# Patient Record
Sex: Female | Born: 1960 | Race: Asian | Hispanic: No | Marital: Married | Smoking: Never smoker
Health system: Southern US, Community
[De-identification: ages and names within clinical notes are randomized; demographics above are authoritative.]

---

## 2007-02-06 ENCOUNTER — Other Ambulatory Visit: Payer: Self-pay | Admitting: Obstetrics & Gynecology

## 2007-02-06 ENCOUNTER — Ambulatory Visit: Payer: Self-pay | Admitting: Obstetrics & Gynecology

## 2007-02-10 ENCOUNTER — Ambulatory Visit (HOSPITAL_COMMUNITY): Admission: RE | Admit: 2007-02-10 | Discharge: 2007-02-10 | Payer: Self-pay | Admitting: Family Medicine

## 2007-02-10 IMAGING — US US OB DETAIL+14 WK
1 series · 14 of 28 positions shown · non-contrast
Comparison: none

OBSTETRICAL ULTRASOUND:
 This ultrasound was performed in The [HOSPITAL], and the [facility] report will be stored to [REDACTED] PACS.

[Series 1: us ob detail+14 wk · 14 of 58 slices shown]
[im 3/58]
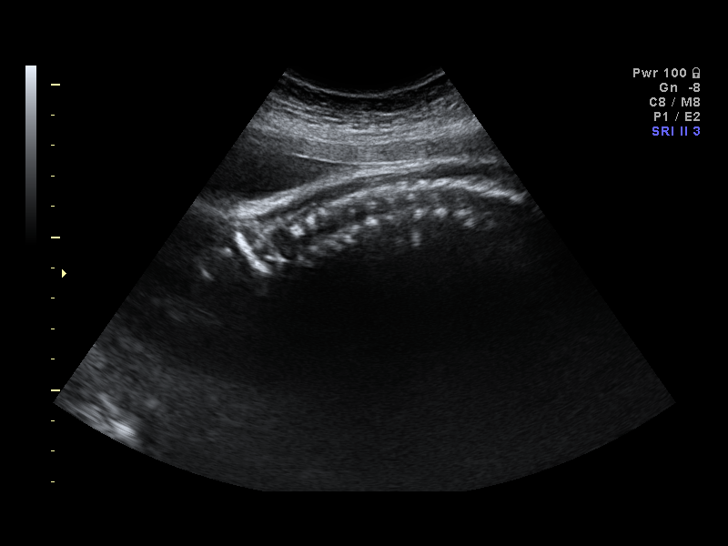
[im 7/58]
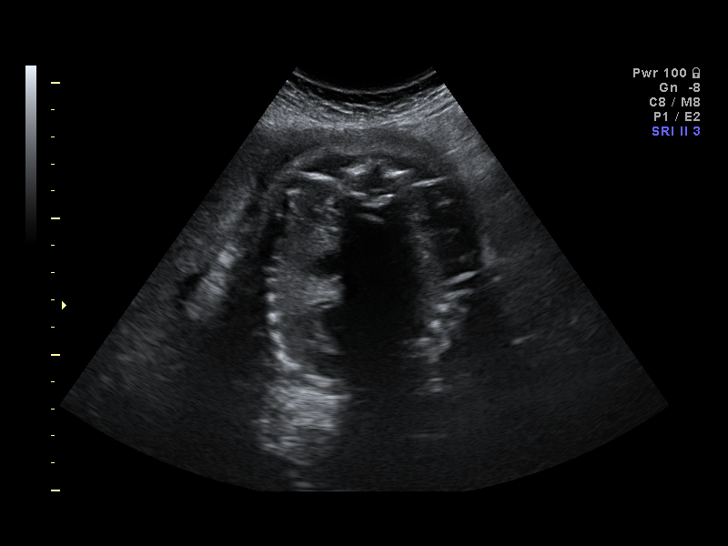
[im 11/58]
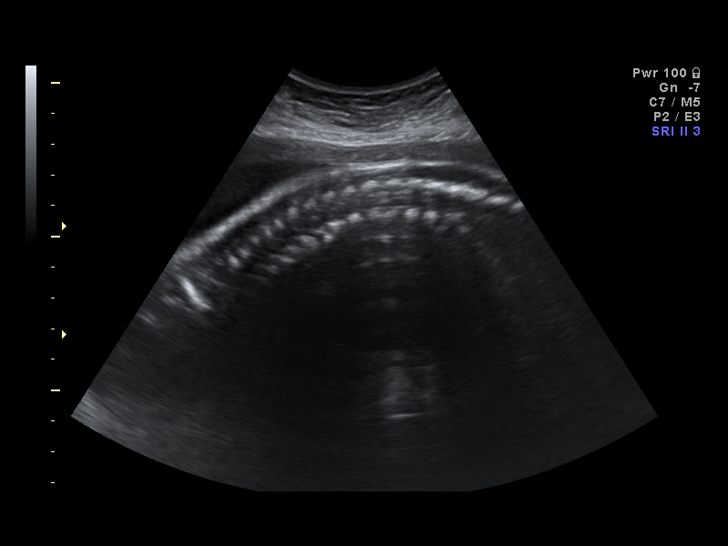
[im 15/58]
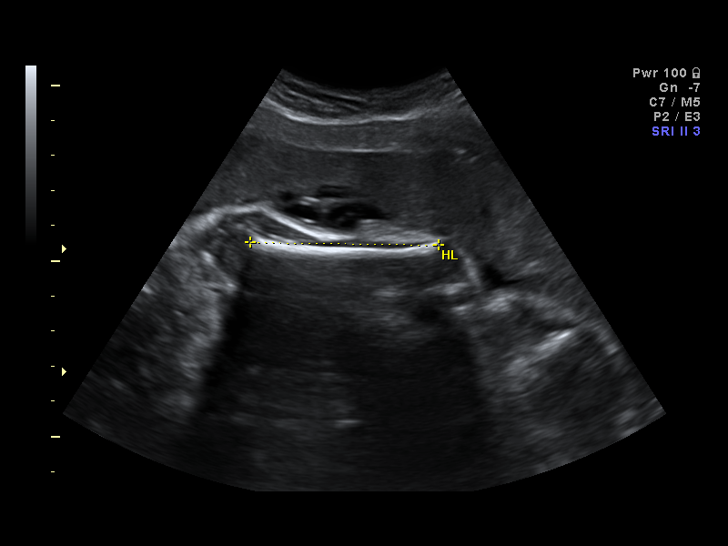
[im 20/58]
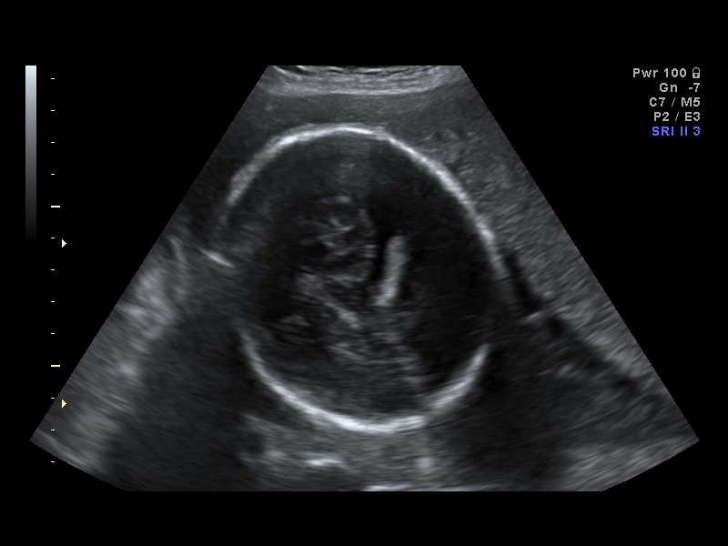
[im 24/58]
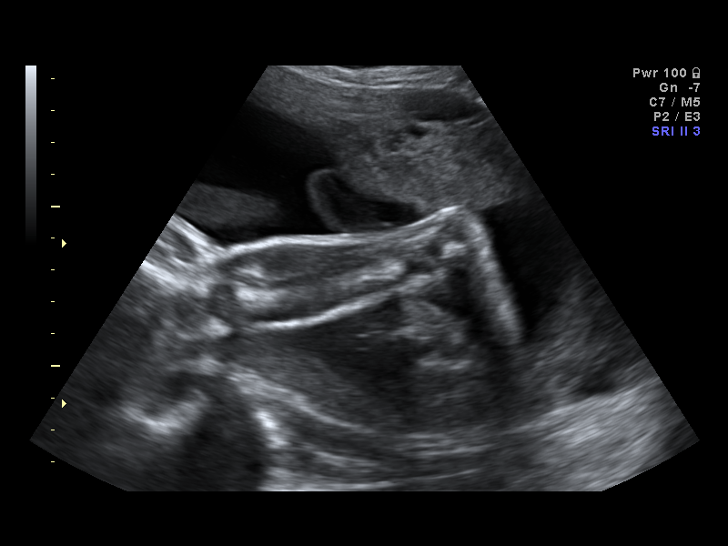
[im 28/58]
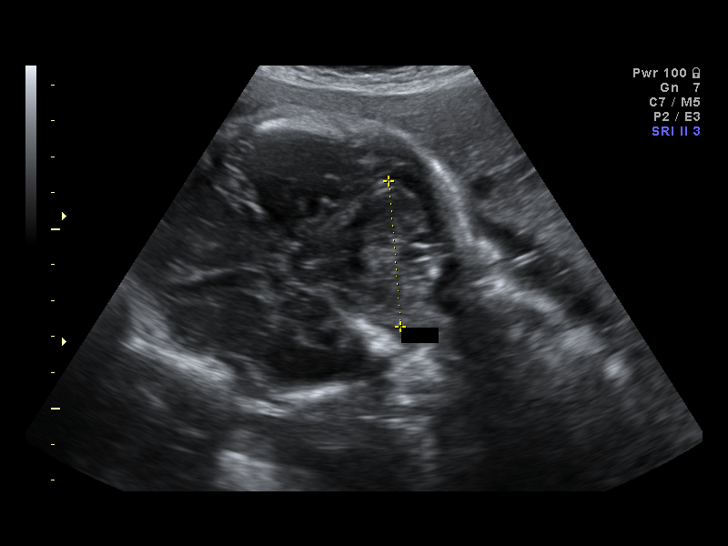
[im 32/58]
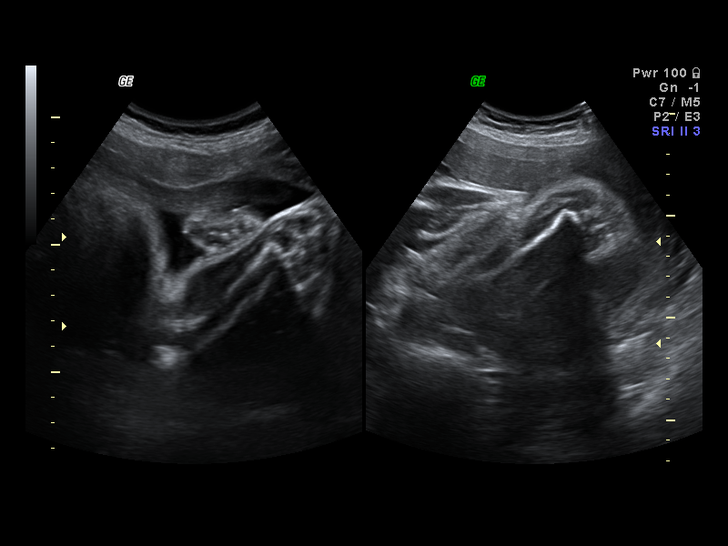
[im 36/58]
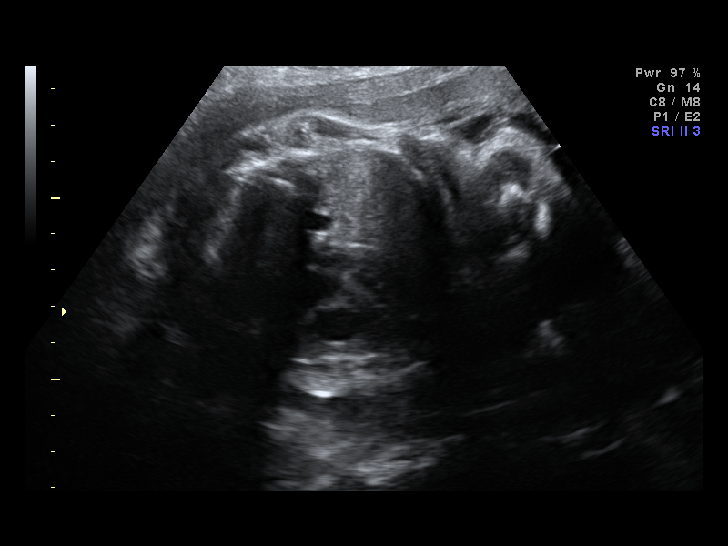
[im 41/58]
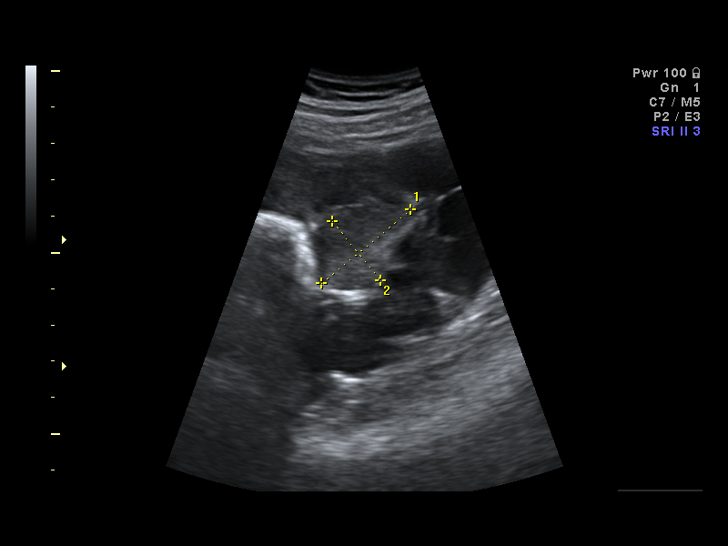
[im 45/58]
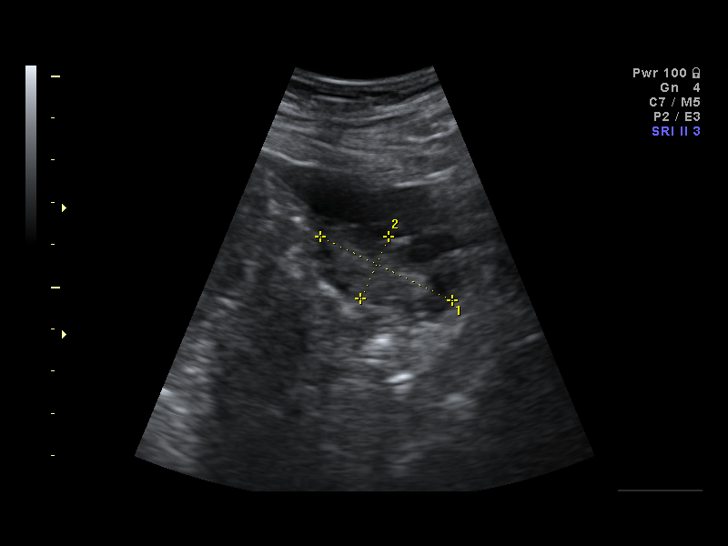
[im 49/58]
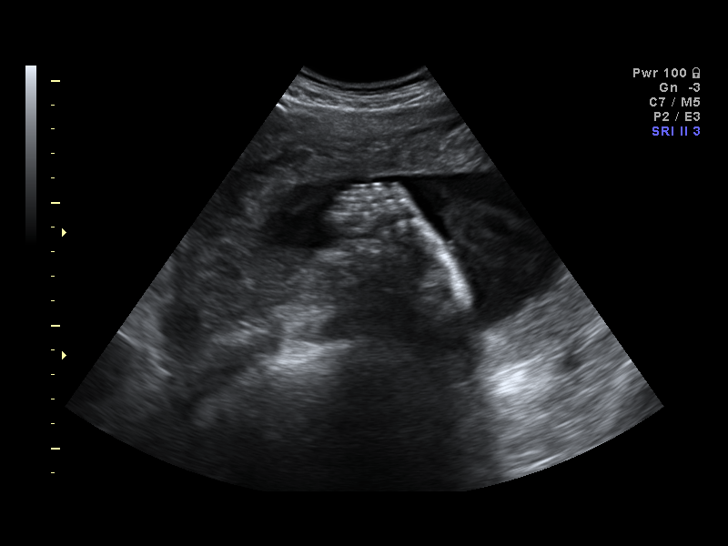
[im 53/58]
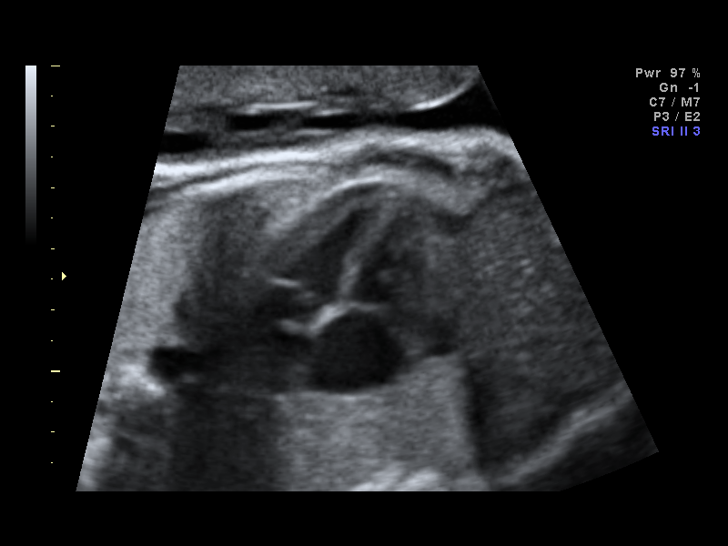
[im 58/58]
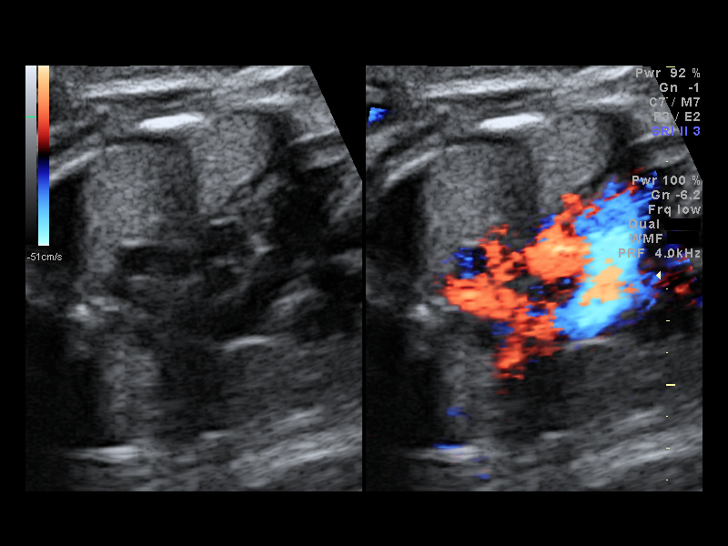

[14 of 28 positions shown; findings below may reference images not displayed]

IMPRESSION: The [facility] report has also been faxed to the ordering physician.

## 2007-03-08 ENCOUNTER — Ambulatory Visit (HOSPITAL_COMMUNITY): Admission: RE | Admit: 2007-03-08 | Discharge: 2007-03-08 | Payer: Self-pay | Admitting: Obstetrics & Gynecology

## 2011-05-11 LAB — POCT URINALYSIS DIP (DEVICE)
Bilirubin Urine: NEGATIVE
Glucose, UA: NEGATIVE
Ketones, ur: NEGATIVE
Nitrite: NEGATIVE
Operator id: 135281
Protein, ur: NEGATIVE
Specific Gravity, Urine: 1.01
Urobilinogen, UA: 0.2
pH: 7

## 2018-02-01 DIAGNOSIS — S90561A Insect bite (nonvenomous), right ankle, initial encounter: Secondary | ICD-10-CM | POA: Insufficient documentation

## 2018-02-01 DIAGNOSIS — Y998 Other external cause status: Secondary | ICD-10-CM | POA: Insufficient documentation

## 2018-02-01 DIAGNOSIS — Y929 Unspecified place or not applicable: Secondary | ICD-10-CM | POA: Diagnosis not present

## 2018-02-01 DIAGNOSIS — R2241 Localized swelling, mass and lump, right lower limb: Secondary | ICD-10-CM | POA: Diagnosis present

## 2018-02-01 DIAGNOSIS — W57XXXA Bitten or stung by nonvenomous insect and other nonvenomous arthropods, initial encounter: Secondary | ICD-10-CM | POA: Diagnosis not present

## 2018-02-01 DIAGNOSIS — L03115 Cellulitis of right lower limb: Secondary | ICD-10-CM | POA: Diagnosis not present

## 2018-02-01 DIAGNOSIS — Y9389 Activity, other specified: Secondary | ICD-10-CM | POA: Insufficient documentation

## 2018-02-02 ENCOUNTER — Other Ambulatory Visit: Payer: Self-pay

## 2018-02-02 ENCOUNTER — Encounter (HOSPITAL_COMMUNITY): Payer: Self-pay | Admitting: Emergency Medicine

## 2018-02-02 ENCOUNTER — Emergency Department (HOSPITAL_COMMUNITY)
Admission: EM | Admit: 2018-02-02 | Discharge: 2018-02-02 | Disposition: A | Discharge status: Home / Self Care | Payer: BLUE CROSS/BLUE SHIELD | Attending: Emergency Medicine | Admitting: Emergency Medicine

## 2018-02-02 DIAGNOSIS — L03115 Cellulitis of right lower limb: Secondary | ICD-10-CM

## 2018-02-02 MED ORDER — DOXYCYCLINE HYCLATE 100 MG PO CAPS: 100.0000 mg | ORAL_CAPSULE | Freq: Two times a day (BID) | ORAL | 0 refills | Status: AC

## 2018-02-02 MED ORDER — NAPROXEN 500 MG PO TABS: 500.0000 mg | ORAL_TABLET | Freq: Two times a day (BID) | ORAL | 0 refills | Status: AC | PRN

## 2018-02-02 MED ORDER — IBUPROFEN 800 MG PO TABS
800.0000 mg | ORAL_TABLET | Freq: Once | ORAL | Status: AC
Start: 2018-02-02 — End: 2018-02-02
  Administered 2018-02-02: 800 mg via ORAL
  Filled 2018-02-02: qty 1

## 2018-02-02 MED ORDER — LORATADINE 10 MG PO TABS
10.0000 mg | ORAL_TABLET | Freq: Once | ORAL | Status: AC
  Administered 2018-02-02: 10 mg via ORAL
  Filled 2018-02-02: qty 1

## 2018-02-02 NOTE — ED Triage Notes (Signed)
Pt has c/o insect bite to back of right foot. Pt felt a sting last night and had itching initially on entire foot. Pt now has swelling and pain 6/10.

## 2018-02-02 NOTE — ED Notes (Signed)
Pt denies N/V/ fever

## 2018-02-02 NOTE — ED Provider Notes (Signed)
[facility]-EMERGENCY DEPT Provider Note   CSN: [id number] Arrival date & time: 02/01/18  2355    History   Chief Complaint Chief Complaint  Patient presents with  . Insect Bite    HPI Kelly Stokes is a 57 y.o. female.   57 y/o female presents to the ED for evaluation of suspected insect bite.  Patient noted area up to the back of her right ankle yesterday.  She states that she felt a stinging sensation last night and has been experiencing ongoing itching.  Symptoms progressed to include pain and soreness which is aggravated with ankle movement.  Discomfort rated at 6/10.  There has been some associated swelling.  No fevers, difficulty ambulating, N/V, numbness or paresthesias.  No medications taken prior to arrival for symptoms.  Patient did not see what insect caused the suspected bite.      History reviewed. No pertinent past medical history.  There are no active problems to display for this patient.   History reviewed. No pertinent surgical history.   OB History   None      Home Medications    Prior to Admission medications   Medication Sig Start Date End Date Taking? Authorizing Provider  doxycycline (VIBRAMYCIN) 100 MG capsule Take 1 capsule (100 mg total) by mouth 2 (two) times daily. 02/02/18   Antony Madura, PA-C  naproxen (NAPROSYN) 500 MG tablet Take 1 tablet (500 mg total) by mouth every 12 (twelve) hours as needed for mild pain or moderate pain. 02/02/18   Antony Madura, PA-C    Family History No family history on file.  Social History Social History   Tobacco Use  . Smoking status: Never Smoker  . Smokeless tobacco: Never Used  Substance Use Topics  . Alcohol use: Not Currently  . Drug use: Not Currently     Allergies   Patient has no known allergies.   Review of Systems Review of Systems Ten systems reviewed and are negative for acute change, except as noted in the HPI.    Physical Exam Updated Vital Signs BP (!) 145/86  (BP Location: Right Arm)   Pulse 92   Temp 98 F (36.7 C) (Oral)   Resp 18   Ht 5\' 3"  (1.6 m)   Wt 79.4 kg (175 lb)   SpO2 98%   BMI 31.00 kg/m   Physical Exam  Constitutional: She is oriented to person, place, and time. She appears well-developed and well-nourished. No distress.  Nontoxic appearing and in NAD  HENT:  Head: Normocephalic and atraumatic.  Eyes: Conjunctivae and EOM are normal. No scleral icterus.  Neck: Normal range of motion.  Cardiovascular: Normal rate, regular rhythm and intact distal pulses.  Pulmonary/Chest: Effort normal. No respiratory distress.  Respirations even and unlabored  Musculoskeletal: Normal range of motion.  Papular area with skin sloughing notes to the medial posterior R heel; see below. Scant serous drainage. Surrounding edema; nonpitting. No induration or fluctuance. Erythema noted with mild heat to touch. No lymphangitic streaking.   Neurological: She is alert and oriented to person, place, and time. She exhibits normal muscle tone. Coordination normal.  Skin: Skin is warm and dry. No rash noted. She is not diaphoretic. No erythema. No pallor.  Psychiatric: She has a normal mood and affect. Her behavior is normal.  Nursing note and vitals reviewed.         ED Treatments / Results  Labs (all labs ordered are listed, but only abnormal results are displayed) Labs Reviewed -  No data to display  EKG None  Radiology No results found.  Procedures Procedures (including critical care time)  Medications Ordered in ED Medications  ibuprofen (ADVIL,MOTRIN) tablet 800 mg (800 mg Oral Given 02/02/18 0120)  loratadine (CLARITIN) tablet 10 mg (10 mg Oral Given 02/02/18 0120)     Initial Impression / Assessment and Plan / ED Course  I have reviewed the triage vital signs and the nursing notes.  Pertinent labs & imaging results that were available during my care of the patient were reviewed by me and considered in my medical decision  making (see chart for details).     57 year old female presents to the emergency department for suspected insect bite.  This is to her medial posterior right ankle.  She has normal active and passive range of motion of the right ankle joint.  There is some soft tissue swelling associated with suspected bite site.  Scant serous drainage without induration, fluctuance, purulence.  Area is also associated with mild erythema.  No lymphangitic streaking.  The patient is afebrile and denies any fevers prior to arrival.  Incident occurred yesterday, but pain has increased.  There is concern for developing cellulitis.  Plan to manage on anti-inflammatories for pain.  Patient given a prescription for antibiotics to complete for cellulitis coverage.  Encouraged Claritin or Zyrtec for itching.  Return precautions discussed and provided. Patient discharged in stable condition with no unaddressed concerns.   Final Clinical Impressions(s) / ED Diagnoses   Final diagnoses:  Cellulitis of right lower extremity    ED Discharge Orders        Ordered    doxycycline (VIBRAMYCIN) 100 MG capsule  2 times daily     02/02/18 0146    naproxen (NAPROSYN) 500 MG tablet  Every 12 hours PRN     02/02/18 0146       Antony MaduraHumes, Delmer Kowalski, PA-C 02/02/18 0209    Nira Connardama, Pedro Eduardo, MD 02/02/18 [phone]

## 2018-02-02 NOTE — Discharge Instructions (Signed)
Take Naproxen for pain as prescribed. We recommend daily Zyrtec of Claritin for itching. You may get further improvement with topical hydrocortisone. These can be purchased at your local pharmacy.  There is concern for infection at the site of your bite. Take the antibiotics prescribed to you until finished. Take this medication with a full glass of milk or water. Follow up with a primary care doctor to ensure resolution of symptoms. You may return if symptoms persist or worsen such as if you develop worsening pain, worsening redness, or fever.
